# Patient Record
Sex: Male | Born: 1995 | Race: Black or African American | Hispanic: No | Marital: Single | Smoking: Current some day smoker
Health system: Southern US, Community
[De-identification: ages and names within clinical notes are randomized; demographics above are authoritative.]

## PROBLEM LIST (undated history)

## (undated) DIAGNOSIS — J45909 Unspecified asthma, uncomplicated: Secondary | ICD-10-CM

---

## 2015-04-26 ENCOUNTER — Emergency Department (HOSPITAL_COMMUNITY)
Admission: EM | Admit: 2015-04-26 | Discharge: 2015-04-27 | Disposition: A | Discharge status: Home / Self Care | Payer: Medicaid Other | Attending: Emergency Medicine | Admitting: Emergency Medicine

## 2015-04-26 ENCOUNTER — Encounter (HOSPITAL_COMMUNITY): Payer: Self-pay | Admitting: Emergency Medicine

## 2015-04-26 ENCOUNTER — Emergency Department (HOSPITAL_COMMUNITY): Payer: Medicaid Other

## 2015-04-26 DIAGNOSIS — J029 Acute pharyngitis, unspecified: Secondary | ICD-10-CM | POA: Diagnosis not present

## 2015-04-26 DIAGNOSIS — H9203 Otalgia, bilateral: Secondary | ICD-10-CM | POA: Insufficient documentation

## 2015-04-26 DIAGNOSIS — F17200 Nicotine dependence, unspecified, uncomplicated: Secondary | ICD-10-CM | POA: Diagnosis not present

## 2015-04-26 DIAGNOSIS — J45909 Unspecified asthma, uncomplicated: Secondary | ICD-10-CM | POA: Diagnosis not present

## 2015-04-26 DIAGNOSIS — R0989 Other specified symptoms and signs involving the circulatory and respiratory systems: Secondary | ICD-10-CM | POA: Insufficient documentation

## 2015-04-26 LAB — BASIC METABOLIC PANEL
Anion gap: 12 (ref 5–15)
BUN: 14 mg/dL (ref 6–20)
CALCIUM: 10.1 mg/dL (ref 8.9–10.3)
CO2: 23 mmol/L (ref 22–32)
CREATININE: 1.17 mg/dL (ref 0.61–1.24)
Chloride: 103 mmol/L (ref 101–111)
GFR calc Af Amer: >60 mL/min (ref >60)
GLUCOSE: 112 mg/dL — AB (ref 65–99)
Potassium: 4 mmol/L (ref 3.5–5.1)
Sodium: 138 mmol/L (ref 135–145)

## 2015-04-26 LAB — CBC
HCT: 41.2 % (ref 39.0–52.0)
Hemoglobin: 13.3 g/dL (ref 13.0–17.0)
MCH: 26.9 pg (ref 26.0–34.0)
MCHC: 32.3 g/dL (ref 30.0–36.0)
MCV: 83.2 fL (ref 78.0–100.0)
PLATELETS: 197 10*3/uL (ref 150–400)
RBC: 4.95 MIL/uL (ref 4.22–5.81)
RDW: 12.8 % (ref 11.5–15.5)
WBC: 10 10*3/uL (ref 4.0–10.5)

## 2015-04-26 MED ORDER — ACETAMINOPHEN 325 MG PO TABS
ORAL_TABLET | ORAL | Status: AC
  Filled 2015-04-26: qty 2

## 2015-04-26 MED ORDER — ACETAMINOPHEN 325 MG PO TABS
650.0000 mg | ORAL_TABLET | Freq: Once | ORAL | Status: AC | PRN
  Administered 2015-04-26: 650 mg via ORAL

## 2015-04-26 NOTE — ED Notes (Signed)
Patient here with complaint of bilateral ear pain, sore throat, and chest congestion. States onset today. Sick contact reported with cousin who has the flu.

## 2015-04-27 ENCOUNTER — Emergency Department (HOSPITAL_COMMUNITY)
Admission: EM | Admit: 2015-04-27 | Discharge: 2015-04-27 | Disposition: A | Discharge status: Home / Self Care | Payer: Medicaid Other | Attending: Emergency Medicine | Admitting: Emergency Medicine

## 2015-04-27 ENCOUNTER — Encounter (HOSPITAL_COMMUNITY): Payer: Self-pay | Admitting: Emergency Medicine

## 2015-04-27 DIAGNOSIS — H9209 Otalgia, unspecified ear: Secondary | ICD-10-CM | POA: Diagnosis not present

## 2015-04-27 DIAGNOSIS — R110 Nausea: Secondary | ICD-10-CM | POA: Diagnosis not present

## 2015-04-27 DIAGNOSIS — J45909 Unspecified asthma, uncomplicated: Secondary | ICD-10-CM | POA: Insufficient documentation

## 2015-04-27 DIAGNOSIS — F17200 Nicotine dependence, unspecified, uncomplicated: Secondary | ICD-10-CM | POA: Insufficient documentation

## 2015-04-27 DIAGNOSIS — J029 Acute pharyngitis, unspecified: Secondary | ICD-10-CM | POA: Diagnosis present

## 2015-04-27 DIAGNOSIS — J111 Influenza due to unidentified influenza virus with other respiratory manifestations: Secondary | ICD-10-CM

## 2015-04-27 HISTORY — DX: Unspecified asthma, uncomplicated: J45.909

## 2015-04-27 LAB — RAPID STREP SCREEN (MED CTR MEBANE ONLY): STREPTOCOCCUS, GROUP A SCREEN (DIRECT): NEGATIVE

## 2015-04-27 MED ORDER — IBUPROFEN 400 MG PO TABS
800.0000 mg | ORAL_TABLET | Freq: Once | ORAL | Status: AC
  Administered 2015-04-27: 800 mg via ORAL
  Filled 2015-04-27: qty 2

## 2015-04-27 MED ORDER — IBUPROFEN 800 MG PO TABS: 800.0000 mg | ORAL_TABLET | Freq: Three times a day (TID) | ORAL | Status: AC

## 2015-04-27 MED ORDER — ONDANSETRON 4 MG PO TBDP
8.0000 mg | ORAL_TABLET | Freq: Once | ORAL | Status: AC
  Administered 2015-04-27: 8 mg via ORAL
  Filled 2015-04-27: qty 2

## 2015-04-27 MED ORDER — DEXAMETHASONE SODIUM PHOSPHATE 10 MG/ML IJ SOLN
10.0000 mg | Freq: Once | INTRAMUSCULAR | Status: AC
  Administered 2015-04-27: 10 mg via INTRAMUSCULAR
  Filled 2015-04-27: qty 1

## 2015-04-27 MED ORDER — LIDOCAINE VISCOUS 2 % MT SOLN: 20.0000 mL | OROMUCOSAL | Status: AC | PRN

## 2015-04-27 MED ORDER — ACETAMINOPHEN 500 MG PO TABS: 500.0000 mg | ORAL_TABLET | Freq: Four times a day (QID) | ORAL | Status: AC | PRN

## 2015-04-27 NOTE — Discharge Instructions (Signed)
Influenza, Adult Influenza ("the flu") is a viral infection of the respiratory tract. It occurs more often in winter months because people spend more time in close contact with one another. Influenza can make you feel very sick. Influenza easily spreads from person to person (contagious). CAUSES  Influenza is caused by a virus that infects the respiratory tract. You can catch the virus by breathing in droplets from an infected person's cough or sneeze. You can also catch the virus by touching something that was recently contaminated with the virus and then touching your mouth, nose, or eyes. RISKS AND COMPLICATIONS You may be at risk for a more severe case of influenza if you smoke cigarettes, have diabetes, have chronic heart disease (such as heart failure) or lung disease (such as asthma), or if you have a weakened immune system. Elderly people and pregnant women are also at risk for more serious infections. The most common problem of influenza is a lung infection (pneumonia). Sometimes, this problem can require emergency medical care and may be life threatening. SIGNS AND SYMPTOMS  Symptoms typically last 4 to 10 days and may include:  Fever.  Chills.  Headache, body aches, and muscle aches.  Sore throat.  Chest discomfort and cough.  Poor appetite.  Weakness or feeling tired.  Dizziness.  Nausea or vomiting. DIAGNOSIS  Diagnosis of influenza is often made based on your history and a physical exam. A nose or throat swab test can be done to confirm the diagnosis. TREATMENT  In mild cases, influenza goes away on its own. Treatment is directed at relieving symptoms. For more severe cases, your health care provider may prescribe antiviral medicines to shorten the sickness. Antibiotic medicines are not effective because the infection is caused by a virus, not by bacteria. HOME CARE INSTRUCTIONS  Take medicines only as directed by your health care provider.  Use a cool mist humidifier  to make breathing easier.  Get plenty of rest until your temperature returns to normal. This usually takes 3 to 4 days.  Drink enough fluid to keep your urine clear or pale yellow.  Cover yourmouth and nosewhen coughing or sneezing,and wash your handswellto prevent thevirusfrom spreading.  Stay homefromwork orschool untilthe fever is gonefor at least 83full day. PREVENTION  An annual influenza vaccination (flu shot) is the best way to avoid getting influenza. An annual flu shot is now routinely recommended for all adults in the [place] SEEK MEDICAL CARE IF:  You experiencechest pain, yourcough worsens,or you producemore mucus.  Youhave nausea,vomiting, ordiarrhea.  Your fever returns or gets worse. SEEK IMMEDIATE MEDICAL CARE IF:  You havetrouble breathing, you become short of breath,or your skin ornails becomebluish.  You have severe painor stiffnessin the neck.  You develop a sudden headache, or pain in the face or ear.  You have nausea or vomiting that you cannot control. MAKE SURE YOU:   Understand these instructions.  Will watch your condition.  Will get help right away if you are not doing well or get worse.   This information is not intended to replace advice given to you by your health care provider. Make sure you discuss any questions you have with your health care provider.   Document Released: 02/15/2000 Document Revised: 03/10/2014 Document Reviewed: 05/19/2011 Elsevier Interactive Patient Education 2016 [employer]  Influenza, Adult Influenza ("the flu") is a viral infection of the respiratory tract. It occurs more often in winter months because people spend more time in close contact with one another. Influenza can make  03/10/2014 Document Reviewed: 05/19/2011  Elsevier Interactive Patient Education ©2016 [employer]    Influenza, Adult  Influenza ("the flu") is a viral infection of the respiratory tract. It occurs more often in winter months because people spend more time in close contact with one another. Influenza can make you feel very sick. Influenza easily spreads from person to person (contagious).  CAUSES   Influenza is caused by a virus that infects the respiratory tract. You can catch the virus by breathing in droplets from an  infected person's cough or sneeze. You can also catch the virus by touching something that was recently contaminated with the virus and then touching your mouth, nose, or eyes.  RISKS AND COMPLICATIONS  You may be at risk for a more severe case of influenza if you smoke cigarettes, have diabetes, have chronic heart disease (such as heart failure) or lung disease (such as asthma), or if you have a weakened immune system. Elderly people and pregnant women are also at risk for more serious infections. The most common problem of influenza is a lung infection (pneumonia). Sometimes, this problem can require emergency medical care and may be life threatening.  SIGNS AND SYMPTOMS   Symptoms typically last 4 to 10 days and may include:  · Fever.  · Chills.  · Headache, body aches, and muscle aches.  · Sore throat.  · Chest discomfort and cough.  · Poor appetite.  · Weakness or feeling tired.  · Dizziness.  · Nausea or vomiting.  DIAGNOSIS   Diagnosis of influenza is often made based on your history and a physical exam. A nose or throat swab test can be done to confirm the diagnosis.  TREATMENT   In mild cases, influenza goes away on its own. Treatment is directed at relieving symptoms. For more severe cases, your health care provider may prescribe antiviral medicines to shorten the sickness. Antibiotic medicines are not effective because the infection is caused by a virus, not by bacteria.  HOME CARE INSTRUCTIONS  · Take medicines only as directed by your health care provider.  · Use a cool mist humidifier to make breathing easier.  · Get plenty of rest until your temperature returns to normal. This usually takes 3 to 4 days.  · Drink enough fluid to keep your urine clear or pale yellow.  · Cover your mouth and nose when coughing or sneezing, and wash your hands well to prevent the virus from spreading.  · Stay home from work or school until the fever is gone for at least 1 full day.  PREVENTION   An annual influenza  vaccination (flu shot) is the best way to avoid getting influenza. An annual flu shot is now routinely recommended for all adults in the [place]  SEEK MEDICAL CARE IF:  · You experience chest pain, your cough worsens, or you produce more mucus.  · You have nausea, vomiting, or diarrhea.  · Your fever returns or gets worse.  SEEK IMMEDIATE MEDICAL CARE IF:  · You have trouble breathing, you become short of breath, or your skin or nails become bluish.  · You have severe pain or stiffness in the neck.  · You develop a sudden headache, or pain in the face or ear.  · You have nausea or vomiting that you cannot control.  MAKE SURE YOU:   · Understand these instructions.  · Will watch your condition.  · Will get help right away if you are not doing well or get worse.     This information is not intended to replace advice given to you by your health care provider. Make sure you discuss any questions

## 2015-04-27 NOTE — ED Provider Notes (Signed)
CSN: [id number]     Arrival date & time 04/27/15  1025 History  By signing my name below, I, Essence Howell, attest that this documentation has been prepared under the direction and in the presence of Cheri Fowler, PA-C Electronically Signed: Charline Bills, ED Scribe 04/27/2015 at 11:27 AM.   Chief Complaint  Patient presents with  . Sore Throat   The history is provided by the patient. No language interpreter was used.   HPI Comments: Dylan Torres is a 20 y.o. male who presents to the Emergency Department complaining of persistent, moderate, worsening sore throat onset yesterday. Pt reports increased pain with swallowing. He reports associated fever with Tmax 102.7 F yesterday, bilateral ear pain, productive cough, generalized body aches, nausea. Pt has tried ibuprofen without significant relief. He denies vomiting, abdominal pain. He reports sick contacts at home with the flu.  He was seen here yesterday and had labs and CXR; however, due to wait time he left before being seen by a provider.   Past Medical History  Diagnosis Date  . Asthma    History reviewed. No pertinent past surgical history. No family history on file. Social History  Substance Use Topics  . Smoking status: Current Some Day Smoker  . Smokeless tobacco: None  . Alcohol Use: No    Review of Systems  Constitutional: Positive for fever.  HENT: Positive for ear pain and sore throat.   Respiratory: Positive for cough.   Gastrointestinal: Positive for nausea. Negative for vomiting and abdominal pain.  Musculoskeletal: Positive for myalgias.  All other systems reviewed and are negative.  Allergies  Review of patient's allergies indicates no known allergies.  Home Medications   Prior to Admission medications   Medication Sig Start Date End Date Taking? Authorizing Provider  acetaminophen (TYLENOL) 500 MG tablet Take 1 tablet (500 mg total) by mouth every 6 (six) hours as needed. 04/27/15   Cheri Fowler, PA-C  ibuprofen  (ADVIL,MOTRIN) 800 MG tablet Take 1 tablet (800 mg total) by mouth 3 (three) times daily. 04/27/15   Cheri Fowler, PA-C  lidocaine (XYLOCAINE) 2 % solution Use as directed 20 mLs in the mouth or throat as needed for mouth pain. 04/27/15   Bari Handshoe, PA-C   BP 110/68 mmHg  Pulse 100  Temp(Src) 98.9 F (37.2 C) (Oral)  Resp 18  SpO2 100% Physical Exam  Constitutional: He is oriented to person, place, and time. He appears well-developed and well-nourished.  Non-toxic appearance. He does not have a sickly appearance. He does not appear ill.  HENT:  Head: Normocephalic and atraumatic.  Right Ear: Tympanic membrane and external ear normal.  Left Ear: Tympanic membrane and external ear normal.  Nose: Nose normal.  Mouth/Throat: Uvula is midline and mucous membranes are normal. No trismus in the jaw. No uvula swelling. Posterior oropharyngeal edema and posterior oropharyngeal erythema present. No oropharyngeal exudate.  Eyes: Conjunctivae are normal. Pupils are equal, round, and reactive to light. No scleral icterus.  Neck: Normal range of motion. Neck supple. No tracheal deviation present.  Cardiovascular: Normal rate, regular rhythm and normal heart sounds.   No murmur heard. Pulmonary/Chest: Effort normal and breath sounds normal. No accessory muscle usage or stridor. No respiratory distress. He has no wheezes. He has no rhonchi. He has no rales.  Abdominal: Soft. Bowel sounds are normal. He exhibits no distension. There is no tenderness. There is no rebound and no guarding.  Musculoskeletal: Normal range of motion.  Lymphadenopathy:    He has no  cervical adenopathy.  Neurological: He is alert and oriented to person, place, and time.  Speech clear without dysarthria.  Skin: Skin is warm and dry.  Psychiatric: He has a normal mood and affect. His behavior is normal.   ED Course  Procedures (including critical care time) DIAGNOSTIC STUDIES: Oxygen Saturation is 100% on RA, normal by my  interpretation.    COORDINATION OF CARE: 10:47 AM-Discussed treatment plan which includes strep screen/culture, Zofran, ibuprofen and Decadron injection with pt at bedside and pt agreed to plan.   Labs Review Labs Reviewed  RAPID STREP SCREEN (NOT AT [facility])  CULTURE, GROUP A STREP [facility])   Imaging Review Dg Chest 2 View  04/26/2015  CLINICAL DATA:  Fever with congestion for 1 day. EXAM: CHEST  2 VIEW COMPARISON:  None. FINDINGS: The heart size and mediastinal contours are within normal limits. Both lungs are clear. The visualized skeletal structures are unremarkable. IMPRESSION: No active cardiopulmonary disease. Electronically Signed   By: Kennith Center M.D.   On: 04/26/2015 22:49   I have personally reviewed and evaluated these images and lab results as part of my medical decision-making.   EKG Interpretation None      MDM   Final diagnoses:  Influenza    Likely viral etiology.  Labs without acute abnormalities, CXR negative.  Mild post oropharyngeal swelling and erythema.  Remaining HENT exam unremarkable.  Heart RRR, lungs CTAB, abdomen soft and benign.  Rapid strep negative.  Patient given Decadron, zofran, and ibuprofen in ED.  Plan to discharge home with viscous lidocaine, ibuprofen, and zofran.  Follow up PCP.  Discussed return precautions.  Patient agrees and acknowledges the above plan for discharge.  I personally performed the services described in this documentation, which was scribed in my presence. The recorded information has been reviewed and is accurate.    Cheri Fowler, PA-C 04/27/15 1133  Pricilla Loveless, MD 04/28/15 1054

## 2015-04-27 NOTE — ED Notes (Signed)
PT C/O SORE THROAT, BODY ACHES,FEVER,CHILLS. CAME HERE YESTERDAY. HAD LABS DRAWN AND CXR. DID NOT STAY FOR RESULTS DUE TO EXTREME WAIT TIME.

## 2015-04-27 NOTE — ED Notes (Signed)
Registration person handed this RN the patients armband and stated that the patient had left.

## 2015-04-29 LAB — CULTURE, GROUP A STREP (THRC)

## 2015-04-30 ENCOUNTER — Emergency Department (HOSPITAL_COMMUNITY)
Admission: EM | Admit: 2015-04-30 | Discharge: 2015-04-30 | Disposition: A | Discharge status: Home / Self Care | Payer: Medicaid Other | Attending: Emergency Medicine | Admitting: Emergency Medicine

## 2015-04-30 ENCOUNTER — Encounter (HOSPITAL_COMMUNITY): Payer: Self-pay

## 2015-04-30 DIAGNOSIS — J45909 Unspecified asthma, uncomplicated: Secondary | ICD-10-CM | POA: Insufficient documentation

## 2015-04-30 DIAGNOSIS — J111 Influenza due to unidentified influenza virus with other respiratory manifestations: Secondary | ICD-10-CM | POA: Insufficient documentation

## 2015-04-30 DIAGNOSIS — R05 Cough: Secondary | ICD-10-CM | POA: Diagnosis present

## 2015-04-30 DIAGNOSIS — F17200 Nicotine dependence, unspecified, uncomplicated: Secondary | ICD-10-CM | POA: Insufficient documentation

## 2015-04-30 DIAGNOSIS — Z791 Long term (current) use of non-steroidal anti-inflammatories (NSAID): Secondary | ICD-10-CM | POA: Diagnosis not present

## 2015-04-30 MED ORDER — BENZONATATE 100 MG PO CAPS: 100.0000 mg | ORAL_CAPSULE | Freq: Three times a day (TID) | ORAL | Status: AC | PRN

## 2015-04-30 MED ORDER — ONDANSETRON 4 MG PO TBDP
8.0000 mg | ORAL_TABLET | Freq: Once | ORAL | Status: AC
  Administered 2015-04-30: 8 mg via ORAL
  Filled 2015-04-30: qty 2

## 2015-04-30 MED ORDER — ONDANSETRON HCL 4 MG PO TABS: 4.0000 mg | ORAL_TABLET | Freq: Four times a day (QID) | ORAL | Status: AC

## 2015-04-30 NOTE — ED Notes (Signed)
Pt presents with flu like symptoms for the past week

## 2015-04-30 NOTE — Discharge Instructions (Signed)

## 2015-04-30 NOTE — ED Provider Notes (Signed)
CSN: [id number]     Arrival date & time 04/30/15  1954 History  By signing my name below, I, Gonzella Lex, attest that this documentation has been prepared under the direction and in the presence of Cheri Fowler, PA-C. Electronically Signed: Gonzella Lex, Scribe. 04/30/2015. 8:40 PM.   Chief Complaint  Patient presents with  . Cough  . Influenza   The history is provided by the patient. No language interpreter was used.    HPI Comments: Dylan Torres is a 20 y.o. male who presents to the Emergency Department complaining of continuing flu-like symptoms which have been ongoing for about one week now, and requesting prescription for zofran. Patient states he thought he was supposed to receive a prescription for zofran at the time, but did not.  He states that when he had it in the ED, it greatly improved his nausea and he was able to eat.  Pt reports associated fever, cough, congestion, nausea, vomiting and diarrhea. He was seen in the ED three days ago and has been taking tylenol and ibuprofen with no relief since being seen.  Denies any new complaints. He states that he is feeling better than before.   Past Medical History  Diagnosis Date  . Asthma    History reviewed. No pertinent past surgical history. History reviewed. No pertinent family history. Social History  Substance Use Topics  . Smoking status: Current Some Day Smoker  . Smokeless tobacco: None  . Alcohol Use: No    Review of Systems  Constitutional: Positive for fever.  HENT: Positive for congestion.   Respiratory: Positive for cough.   Gastrointestinal: Positive for nausea, vomiting and diarrhea.  Musculoskeletal: Positive for back pain.  All other systems reviewed and are negative.  Allergies  Review of patient's allergies indicates no known allergies.  Home Medications   Prior to Admission medications   Medication Sig Start Date End Date Taking? Authorizing Provider  acetaminophen (TYLENOL) 500 MG  tablet Take 1 tablet (500 mg total) by mouth every 6 (six) hours as needed. 04/27/15   Cheri Fowler, PA-C  benzonatate (TESSALON) 100 MG capsule Take 1 capsule (100 mg total) by mouth 3 (three) times daily as needed for cough. 04/30/15   Cheri Fowler, PA-C  ibuprofen (ADVIL,MOTRIN) 800 MG tablet Take 1 tablet (800 mg total) by mouth 3 (three) times daily. 04/27/15   Cheri Fowler, PA-C  lidocaine (XYLOCAINE) 2 % solution Use as directed 20 mLs in the mouth or throat as needed for mouth pain. 04/27/15   Cheri Fowler, PA-C  ondansetron (ZOFRAN) 4 MG tablet Take 1 tablet (4 mg total) by mouth every 6 (six) hours. 04/30/15   Lenus Trauger, PA-C   BP 115/70 mmHg  Pulse 81  Temp(Src) 99.4 F (37.4 C) (Oral)  Resp 18  SpO2 98% Physical Exam  Constitutional: He is oriented to person, place, and time. He appears well-developed and well-nourished.  Non-toxic appearance. He does not have a sickly appearance. He does not appear ill.  HENT:  Head: Normocephalic and atraumatic.  Right Ear: External ear normal.  Left Ear: External ear normal.  Mouth/Throat: Oropharynx is clear and moist and mucous membranes are normal.  Eyes: Conjunctivae are normal. Pupils are equal, round, and reactive to light. No scleral icterus.  Neck: Normal range of motion. Neck supple. No tracheal deviation present.  Cardiovascular: Normal rate, regular rhythm and normal heart sounds.   No murmur heard. Pulmonary/Chest: Effort normal and breath sounds normal. No accessory muscle usage or  stridor. No respiratory distress. He has no wheezes. He has no rhonchi. He has no rales.  Abdominal: Soft. Bowel sounds are normal. He exhibits no distension. There is no tenderness.  Musculoskeletal: Normal range of motion.  Lymphadenopathy:    He has no cervical adenopathy.  Neurological: He is alert and oriented to person, place, and time.  Speech clear without dysarthria.  Skin: Skin is warm and dry.  Psychiatric: He has a normal mood and affect. His  behavior is normal.    ED Course  Procedures  DIAGNOSTIC STUDIES:    Oxygen Saturation is 100% on RA, normal by my interpretation.   COORDINATION OF CARE:  8:41 PM Will review labs. Will prescribe pt cough medication and Zofran. Discussed treatment plan with pt at bedside and pt agreed to plan.   Labs Review Labs Reviewed - No data to display  I have personally reviewed and evaluated these lab results as part of my medical decision-making.  MDM   Final diagnoses:  Influenza    Patient presents for medication, Zofran.  Patient reports he was to be prescribed Zofran for his nausea, but did not receive the prescription at his previous ED visit.  After chart review, the patient in fact, did not receive the intended prescription.  VSS, NAD.  Moist mucus membranes.  Heart RRR, lungs CTAB, abdomen soft and benign.  Evaluation does not show pathology requiring ongoing emergent intervention or admission. Pt is hemodynamically stable and mentating appropriately. Discussed findings/results and plan with patient/guardian, who agrees with plan. All questions answered. Return precautions discussed and outpatient follow up given.    I personally performed the services described in this documentation, which was scribed in my presence. The recorded information has been reviewed and is accurate.    Cheri Fowler, PA-C 04/30/15 2104  Eber Hong, MD 05/01/15 [phone]

## 2017-08-08 IMAGING — CR DG CHEST 2V
2 series · 2 of 2 positions shown · non-contrast
Comparison: None.

CLINICAL DATA: Fever with congestion for 1 day.

EXAM:
CHEST  2 VIEW

[chest pa]
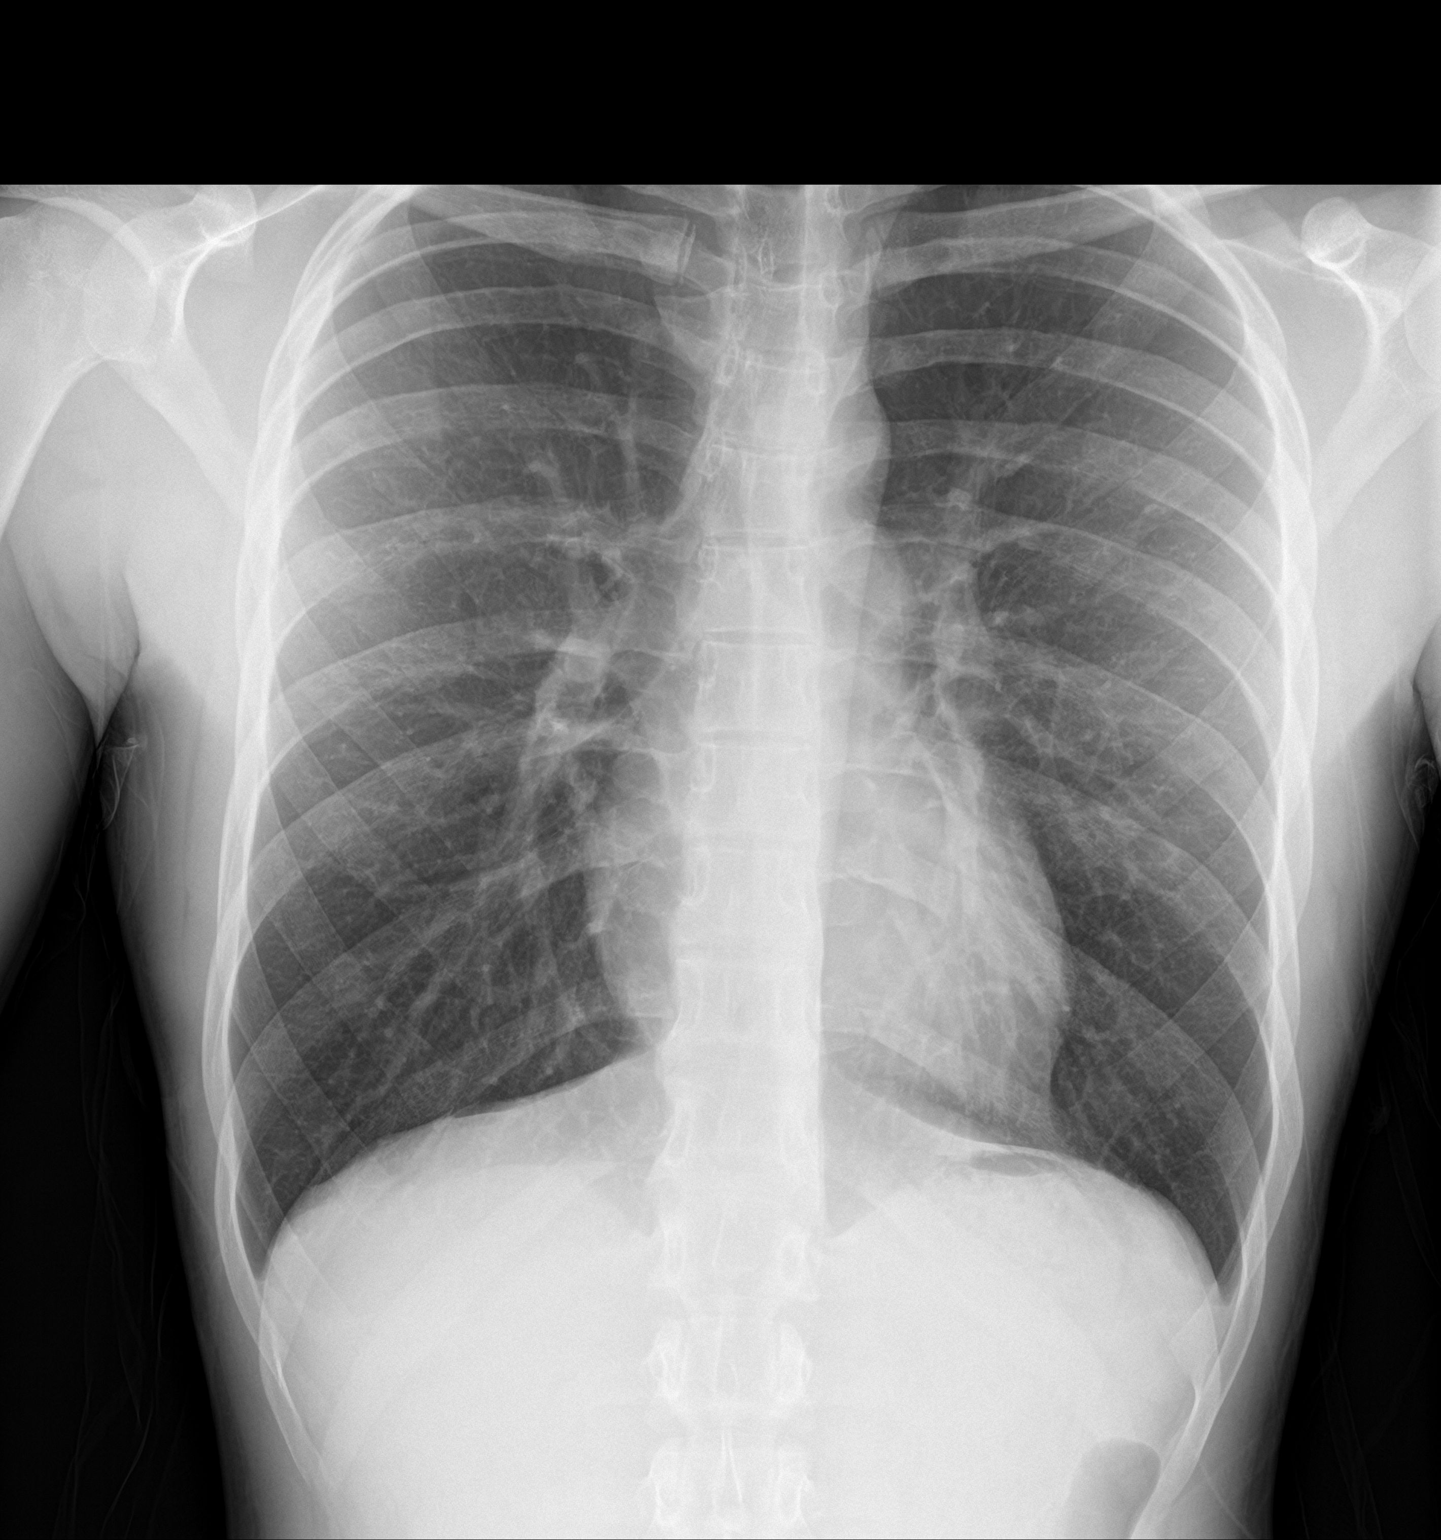

[chest lat]
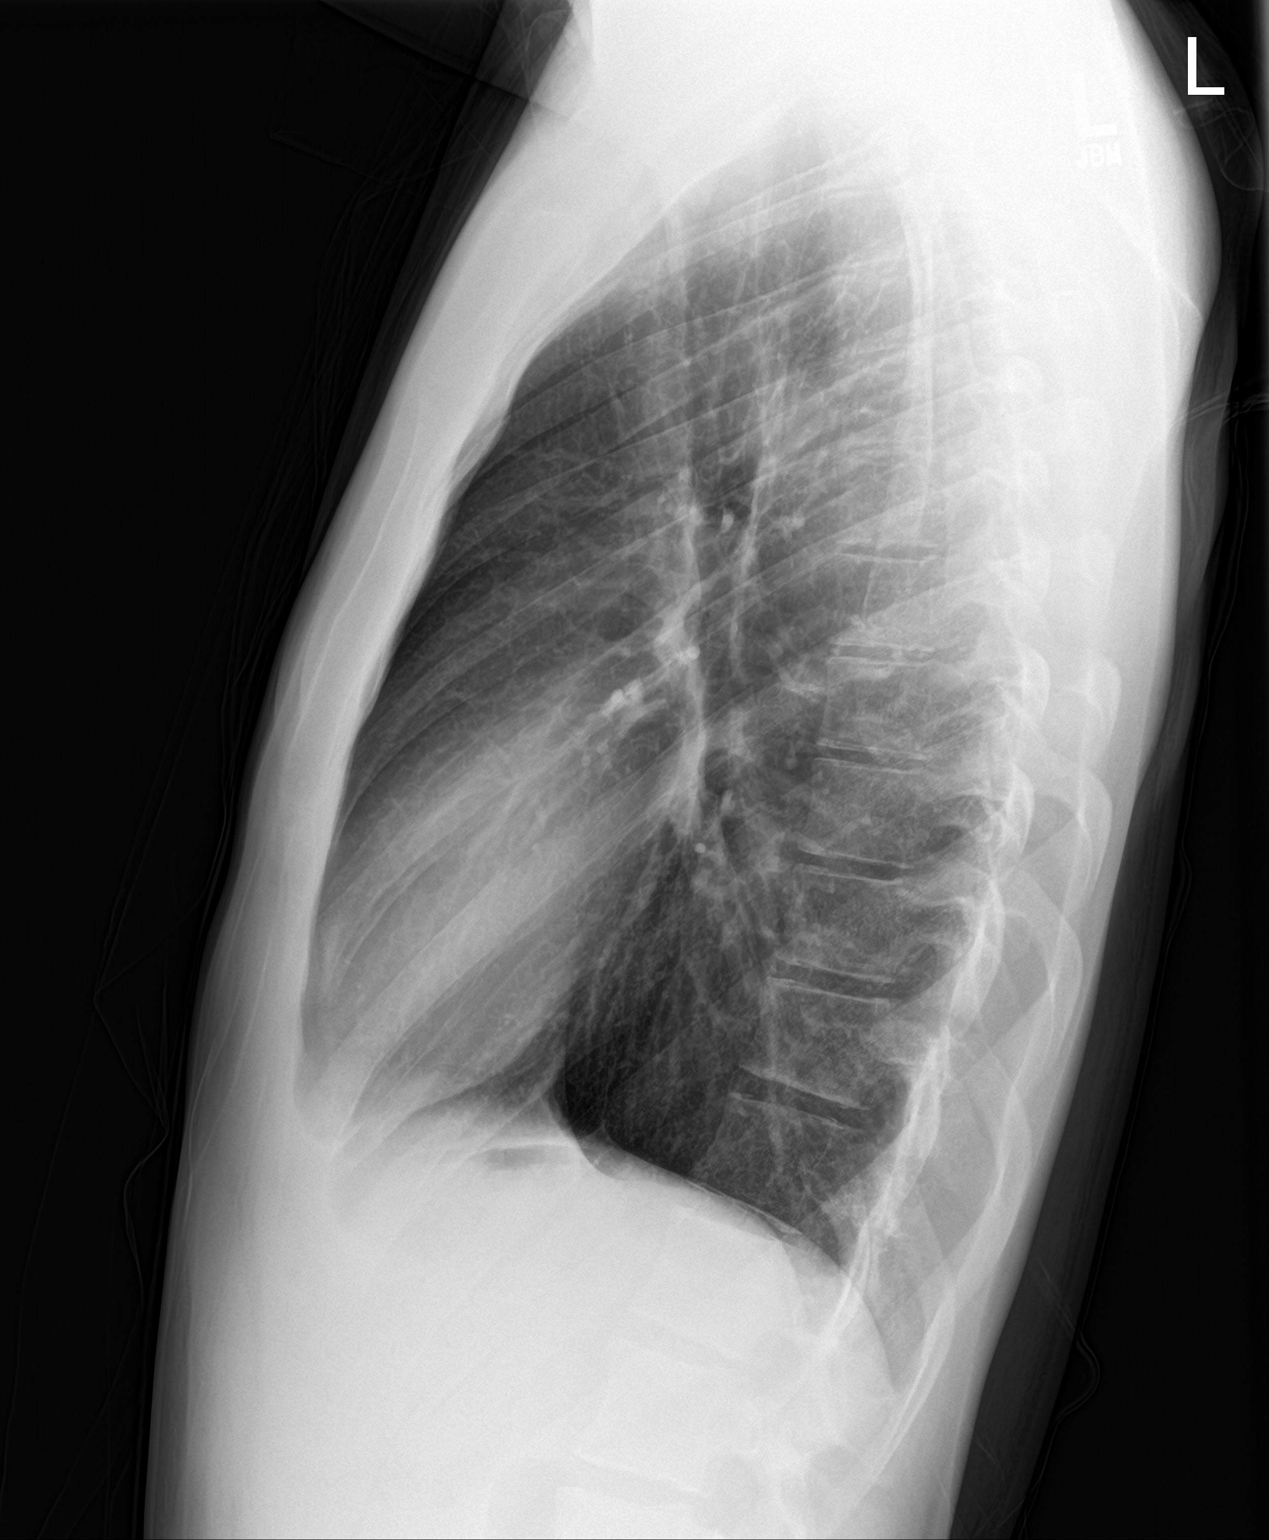

[2 of 2 positions shown; findings below may reference images not displayed]

FINDINGS: The heart size and mediastinal contours are within normal limits.
Both lungs are clear. The visualized skeletal structures are
unremarkable.
IMPRESSION: No active cardiopulmonary disease.
